# Patient Record
Sex: Male | Born: 1935 | Race: White | Hispanic: No | Marital: Married
Health system: Southern US, Community
[De-identification: ages and names within clinical notes are randomized; demographics above are authoritative.]

---

## 2005-07-27 ENCOUNTER — Ambulatory Visit: Payer: Self-pay | Admitting: Family Medicine

## 2006-01-18 ENCOUNTER — Ambulatory Visit: Payer: Self-pay | Admitting: Gastroenterology

## 2006-01-26 ENCOUNTER — Ambulatory Visit: Payer: Self-pay | Admitting: Gastroenterology

## 2006-08-30 ENCOUNTER — Ambulatory Visit: Payer: Self-pay | Admitting: Family Medicine

## 2010-03-23 ENCOUNTER — Ambulatory Visit: Payer: Self-pay | Admitting: Family Medicine

## 2010-05-04 ENCOUNTER — Ambulatory Visit: Payer: Self-pay | Admitting: Gastroenterology

## 2010-05-05 LAB — PATHOLOGY REPORT

## 2012-08-02 IMAGING — US US CAROTID DUPLEX BILAT
1 series · 17 of 24 positions shown · non-contrast
Comparison: none

REASON FOR EXAM: bruit
COMMENTS:

[Series 1: us carotid duplex bilat · 17 of 78 slices shown]
[im 1/78]
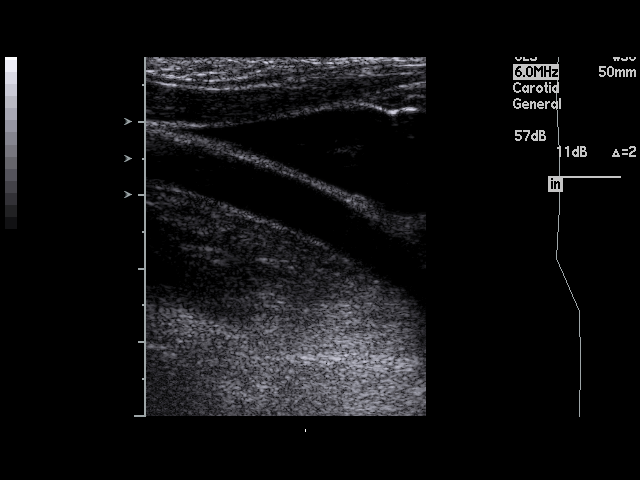
[im 7/78]
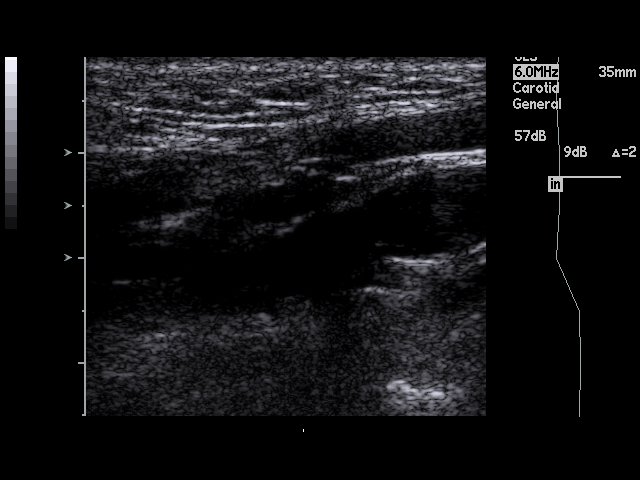
[im 11/78]
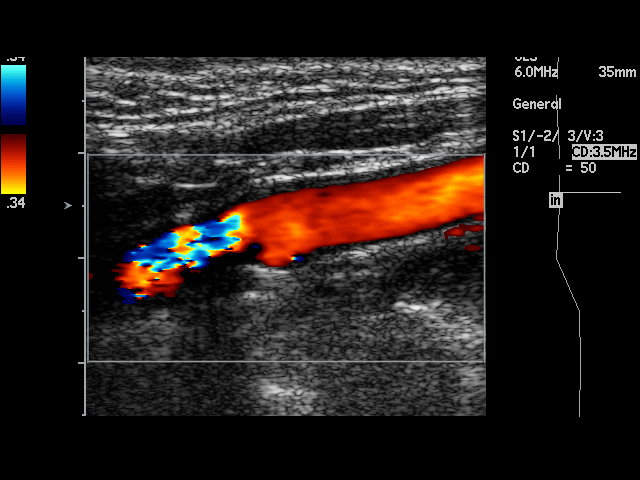
[im 14/78]
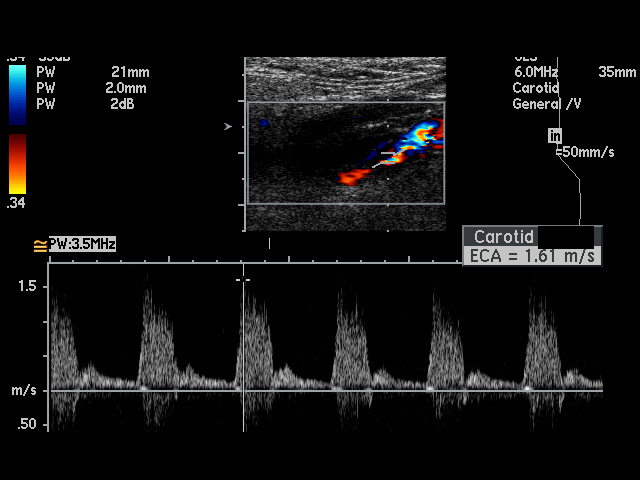
[im 21/78]
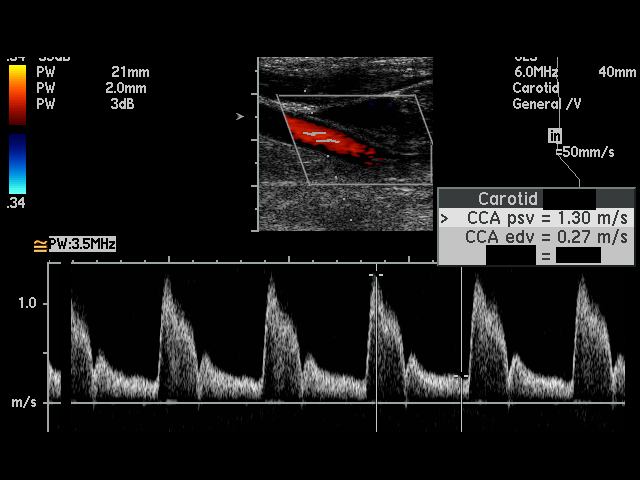
[im 24/78]
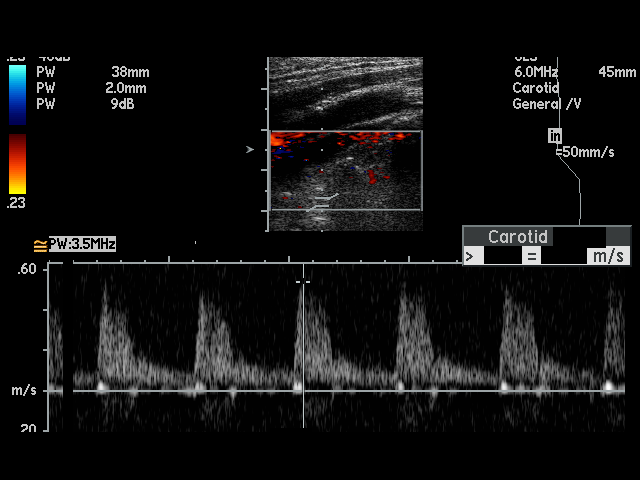
[im 31/78]
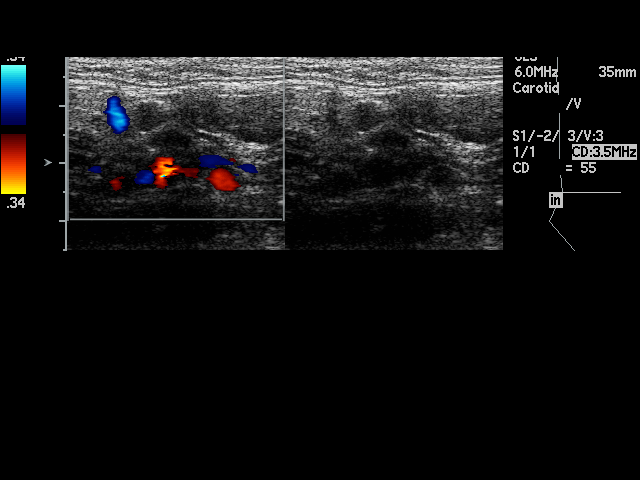
[im 34/78]
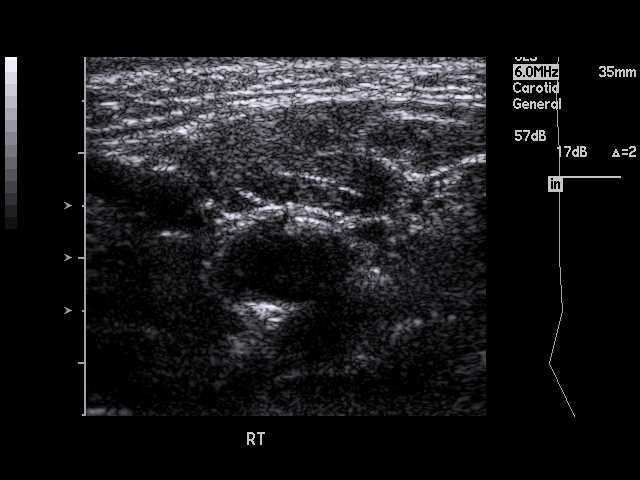
[im 41/78]
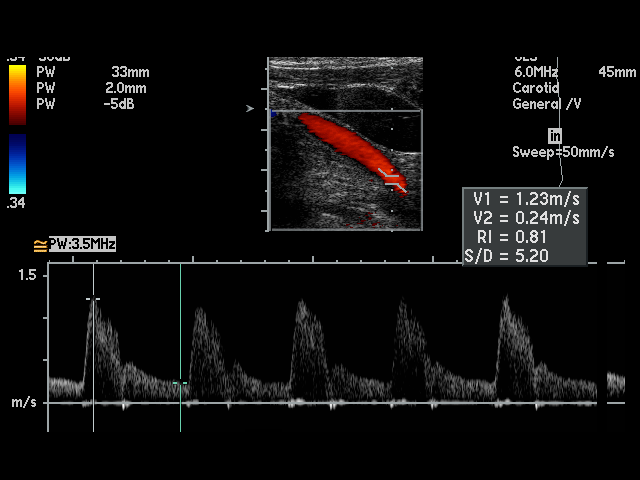
[im 44/78]
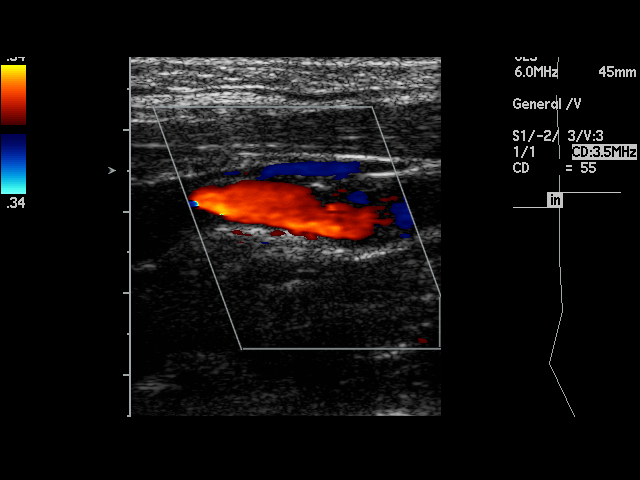
[im 47/78]
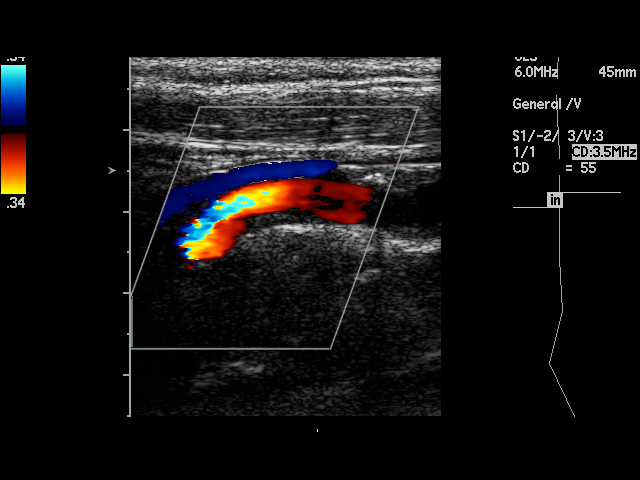
[im 54/78]
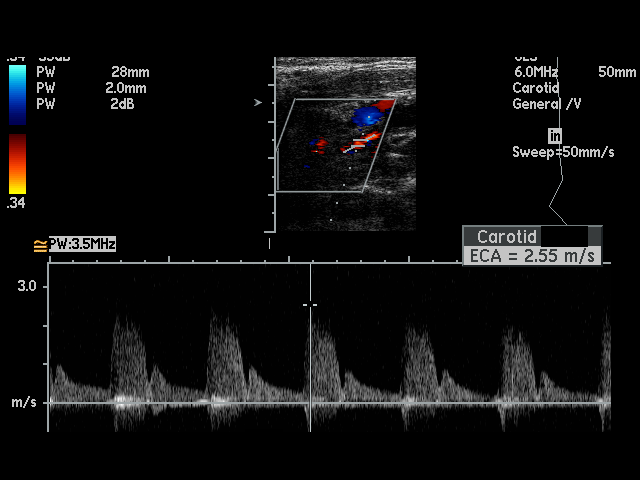
[im 57/78]
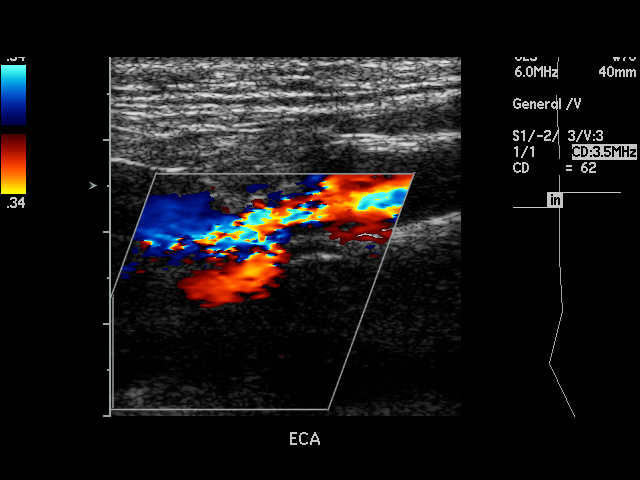
[im 64/78]
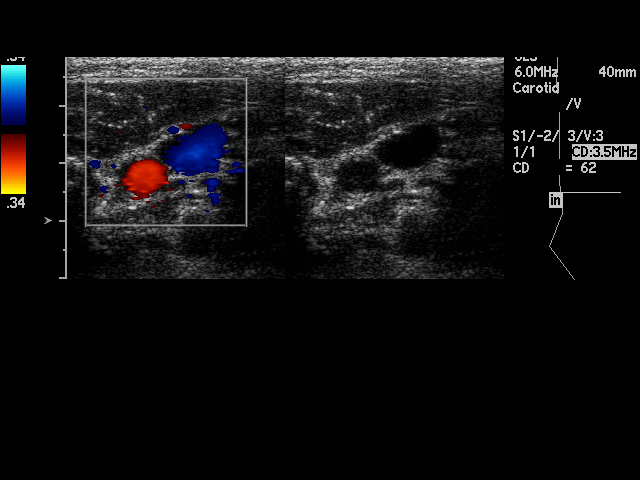
[im 67/78]
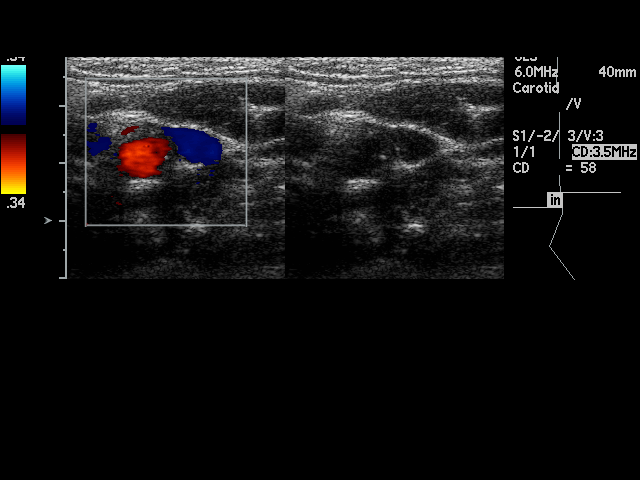
[im 71/78]
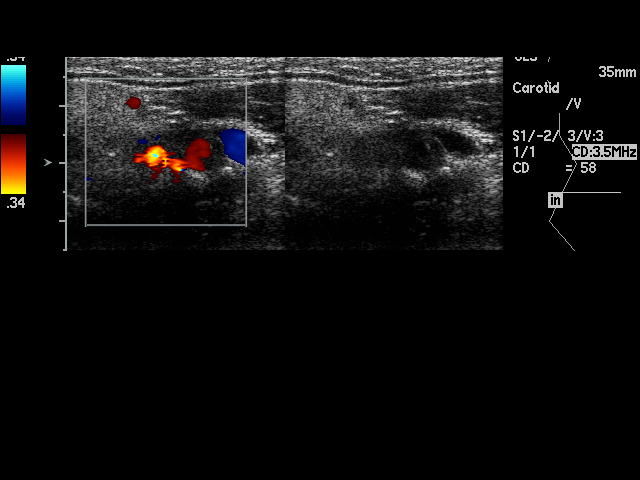
[im 78/78]
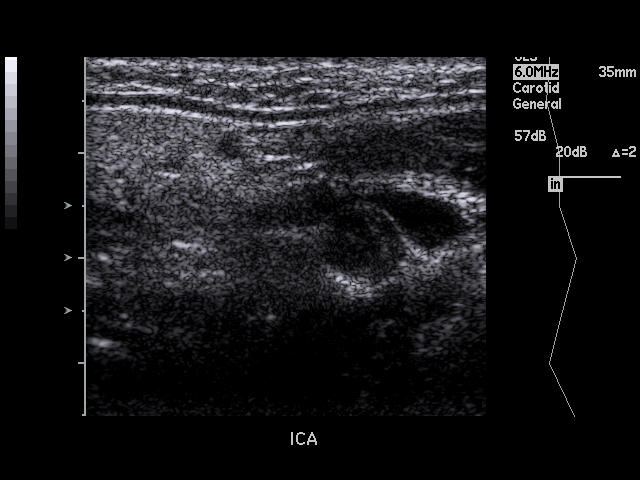

[17 of 24 positions shown; findings below may reference images not displayed]

PROCEDURE:     US  - US CAROTID DOPPLER BILATERAL  - March 23, 2010 [DATE]

RESULT:     There is a small amount of soft and calcific plaque formation
about the carotid bifurcations bilaterally.

On the right, the peak right common carotid artery flow velocity measures
1.3 meters per second and the peak right internal carotid artery flow
velocity measures 0.8 meters per second. The ICA/CCA ratio is 0.615.

On the left, the peak left common carotid artery flow velocity measures
meters per second and the peak left internal carotid artery flow velocity
measures 1.1 meters per second. The ICA/CCA ratio is 1.

These values bilaterally are in the normal range and are consistent with the
absence of hemodynamically significant stenosis.

There is observed antegrade flow in both vertebrals.
IMPRESSION: 1.  There is a small amount of plaque formation noted bilaterally.
2.  No hemodynamically significant stenosis is seen on either side.
3.  There is antegrade flow in both vertebrals.

## 2020-03-27 ENCOUNTER — Emergency Department: Payer: Medicare Other

## 2020-03-27 ENCOUNTER — Emergency Department
Admission: EM | Admit: 2020-03-27 | Discharge: 2020-03-28 | Disposition: A | Discharge status: Home / Self Care | Payer: Medicare Other | Attending: Emergency Medicine | Admitting: Emergency Medicine

## 2020-03-27 DIAGNOSIS — R531 Weakness: Secondary | ICD-10-CM | POA: Diagnosis present

## 2020-03-27 DIAGNOSIS — U071 COVID-19: Secondary | ICD-10-CM | POA: Diagnosis not present

## 2020-03-27 DIAGNOSIS — Z85828 Personal history of other malignant neoplasm of skin: Secondary | ICD-10-CM | POA: Diagnosis not present

## 2020-03-27 LAB — COMPREHENSIVE METABOLIC PANEL
ALT: 23 U/L (ref 0–44)
AST: 36 U/L (ref 15–41)
Albumin: 4.1 g/dL (ref 3.5–5.0)
Alkaline Phosphatase: 55 U/L (ref 38–126)
Anion gap: 12 (ref 5–15)
BUN: 20 mg/dL (ref 8–23)
CO2: 24 mmol/L (ref 22–32)
Calcium: 9 mg/dL (ref 8.9–10.3)
Chloride: 98 mmol/L (ref 98–111)
Creatinine, Ser: 1.14 mg/dL (ref 0.61–1.24)
GFR, Estimated: >60 mL/min (ref >60)
Glucose, Bld: 136 mg/dL — ABNORMAL HIGH (ref 70–99)
Potassium: 4.2 mmol/L (ref 3.5–5.1)
Sodium: 134 mmol/L — ABNORMAL LOW (ref 135–145)
Total Bilirubin: 1.1 mg/dL (ref 0.3–1.2)
Total Protein: 7.2 g/dL (ref 6.5–8.1)

## 2020-03-27 LAB — CBC WITH DIFFERENTIAL/PLATELET
Abs Immature Granulocytes: 0.03 10*3/uL (ref 0.00–0.07)
Basophils Absolute: 0 10*3/uL (ref 0.0–0.1)
Basophils Relative: 0 %
Eosinophils Absolute: 0 10*3/uL (ref 0.0–0.5)
Eosinophils Relative: 0 %
HCT: 40.7 % (ref 39.0–52.0)
Hemoglobin: 13.8 g/dL (ref 13.0–17.0)
Immature Granulocytes: 0 %
Lymphocytes Relative: 9 %
Lymphs Abs: 0.7 10*3/uL (ref 0.7–4.0)
MCH: 31.9 pg (ref 26.0–34.0)
MCHC: 33.9 g/dL (ref 30.0–36.0)
MCV: 94.2 fL (ref 80.0–100.0)
Monocytes Absolute: 0.6 10*3/uL (ref 0.1–1.0)
Monocytes Relative: 8 %
Neutro Abs: 6.3 10*3/uL (ref 1.7–7.7)
Neutrophils Relative %: 83 %
Platelets: 141 10*3/uL — ABNORMAL LOW (ref 150–400)
RBC: 4.32 MIL/uL (ref 4.22–5.81)
RDW: 12.4 % (ref 11.5–15.5)
WBC: 7.6 10*3/uL (ref 4.0–10.5)
nRBC: 0 % (ref 0.0–0.2)

## 2020-03-27 LAB — PROTIME-INR
INR: 1.1 (ref 0.8–1.2)
Prothrombin Time: 14.1 seconds (ref 11.4–15.2)

## 2020-03-27 LAB — LACTIC ACID, PLASMA: Lactic Acid, Venous: 1.2 mmol/L (ref 0.5–1.9)

## 2020-03-27 LAB — TROPONIN I (HIGH SENSITIVITY): Troponin I (High Sensitivity): 22 ng/L — ABNORMAL HIGH (ref <18)

## 2020-03-27 MED ORDER — ACETAMINOPHEN 500 MG PO TABS
1000.0000 mg | ORAL_TABLET | Freq: Once | ORAL | Status: AC
  Administered 2020-03-27: 1000 mg via ORAL

## 2020-03-27 MED ORDER — LACTATED RINGERS IV BOLUS
1000.0000 mL | Freq: Once | INTRAVENOUS | Status: AC
  Administered 2020-03-28: 1000 mL via INTRAVENOUS

## 2020-03-27 NOTE — ED Triage Notes (Signed)
Pt in triage with reports of weakness x3 days. Pt with fever in triage. Pt poor historian. Pt appears confused/altered. Unknown baseline due to no previous hx in chart. Productive cough heard as well as pt nose dripping in triage.

## 2020-03-27 NOTE — ED Provider Notes (Signed)
[facility] Emergency Department Provider Note  ____________________________________________  Time seen: Approximately 11:32 PM  I have reviewed the triage vital signs and the nursing notes.   HISTORY  Chief Complaint No chief complaint on file.   HPI Kenneth Campbell is a 87 y.o. male with a history of hyperlipidemia, GERD, borderline hyperglycemia who presents from home for generalized weakness.  Patient has been feeling ill for 3 days.  Has had a fever, mild productive cough, congestion, generalized weakness, and a sore throat.  Patient is vaccinated against Covid.  No known sick contact exposures.  He denies chest pain or shortness of breath, abdominal pain, vomiting or diarrhea, dysuria or hematuria.  His symptoms have been getting progressively worse.   PMH GERD (gastroesophageal reflux disease)    Hyperlipidemia    Hearing loss in right ear    Left carotid bruit  Last carotid ultrasound, 03/23/10, mild plaque bilaterally, no stenosis.  Gynecomastia, male  with normal mammogram, May 2007  Hyperglycemia    Skin cancer  squamous and basal cell     Allergies Patient has no known allergies.  No family history on file.  Social History Smoking - never Alcohol - no Drugs - no    Review of Systems  Constitutional: + fever, generalized weakness Eyes: Negative for visual changes. ENT: + sore throat and congestion Neck: No neck pain  Cardiovascular: Negative for chest pain. Respiratory: Negative for shortness of breath. + Cough Gastrointestinal: Negative for abdominal pain, vomiting or diarrhea. Genitourinary: Negative for dysuria. Musculoskeletal: Negative for back pain. Skin: Negative for rash. Neurological: Negative for headaches, weakness or numbness. Psych: No SI or HI  ____________________________________________   PHYSICAL EXAM:  VITAL SIGNS: ED Triage Vitals  Enc Vitals Group     BP 03/27/20 2201 (!) 135/59      Pulse Rate 03/27/20 2201 92     Resp 03/27/20 2201 18     Temp 03/27/20 2201 (!) 101.2 F (38.4 C)     Temp Source 03/27/20 2201 Oral     SpO2 03/27/20 2201 96 %     Weight 03/27/20 2202 150 lb (68 kg)     Height 03/27/20 2202 5\' 11"  (1.803 m)     Head Circumference --      Peak Flow --      Pain Score --      Pain Loc --      Pain Edu? --      Excl. in GC? --     Constitutional: Alert and oriented. Well appearing and in no apparent distress. HEENT:      Head: Normocephalic and atraumatic.         Eyes: Conjunctivae are normal. Sclera is non-icteric.       Mouth/Throat: Mucous membranes are moist.       Neck: Supple with no signs of meningismus. Cardiovascular: Regular rate and rhythm. No murmurs, gallops, or rubs. 2+ symmetrical distal pulses are present in all extremities. No JVD. Respiratory: Normal respiratory effort. Lungs are clear to auscultation bilaterally. No wheezes, crackles, or rhonchi.  Gastrointestinal: Soft, non tender, and non distended. Musculoskeletal: No edema, cyanosis, or erythema of extremities. Neurologic: Normal speech and language. Face is symmetric. Moving all extremities. No gross focal neurologic deficits are appreciated. Skin: Skin is warm, dry and intact. No rash noted. Psychiatric: Mood and affect are normal. Speech and behavior are normal.  ____________________________________________   LABS (all labs ordered are listed, but only abnormal results are displayed)  Labs Reviewed  COMPREHENSIVE METABOLIC PANEL - Abnormal; Notable for the following components:      Result Value   Sodium 134 (*)    Glucose, Bld 136 (*)    All other components within normal limits  CBC WITH DIFFERENTIAL/PLATELET - Abnormal; Notable for the following components:   Platelets 141 (*)    All other components within normal limits  URINALYSIS, COMPLETE (UACMP) WITH MICROSCOPIC - Abnormal; Notable for the following components:   Color, Urine YELLOW (*)    APPearance  HAZY (*)    Specific Gravity, Urine 1.031 (*)    Hgb urine dipstick SMALL (*)    Ketones, ur 20 (*)    Protein, ur 100 (*)    All other components within normal limits  POC SARS CORONAVIRUS 2 AG -  ED - Abnormal; Notable for the following components:   SARS Coronavirus 2 Ag POSITIVE (*)    All other components within normal limits  TROPONIN I (HIGH SENSITIVITY) - Abnormal; Notable for the following components:   Troponin I (High Sensitivity) 22 (*)    All other components within normal limits  TROPONIN I (HIGH SENSITIVITY) - Abnormal; Notable for the following components:   Troponin I (High Sensitivity) 22 (*)    All other components within normal limits  CULTURE, BLOOD (ROUTINE X 2)  CULTURE, BLOOD (ROUTINE X 2)  LACTIC ACID, PLASMA  LACTIC ACID, PLASMA  PROTIME-INR  PROCALCITONIN   ____________________________________________  EKG  ED ECG REPORT I, Rudene Re, the attending physician, personally viewed and interpreted this ECG.  Sinus rhythm, rate of 76, normal intervals, T wave inversions in inferior lateral leads with no ST elevations or depressions.  No prior for comparison. ____________________________________________  RADIOLOGY  I have personally reviewed the images performed during this visit and I agree with the Radiologist's read.   Interpretation by Radiologist:  DG Chest 2 View  Result Date: 03/27/2020 CLINICAL DATA:  Weakness for 3 days.  Fever.  Confusion. EXAM: CHEST - 2 VIEW COMPARISON:  CT 01/26/2006 FINDINGS: Emphysematous changes in the lungs. Linear fibrosis or atelectasis in the lung bases. No focal consolidation or edema. No pleural effusions. No pneumothorax. Mediastinal contours appear intact. Normal heart size and pulmonary vascularity. Degenerative changes in the spine and shoulders. IMPRESSION: Emphysematous changes in the lungs with linear fibrosis or atelectasis in the lung bases. Electronically Signed   By: Lucienne Capers M.D.   On:  03/27/2020 23:00     ____________________________________________   PROCEDURES  Procedure(s) performed:yes .1-3 Lead EKG Interpretation Performed by: Rudene Re, MD Authorized by: Rudene Re, MD     Interpretation: non-specific     ECG rate assessment: normal     Rhythm: sinus rhythm     Ectopy: none     Critical Care performed:  None ____________________________________________   INITIAL IMPRESSION / ASSESSMENT AND PLAN / ED COURSE  87 y.o. male with a history of hyperlipidemia, GERD, borderline hyperglycemia who presents from home for generalized weakness, cough, malaise, sore throat, congestion x3 days.  Patient vaccinated against Covid.  Normal work of breathing and normal sats, febrile with a temp of 103.39F but not tachycardic.  Normotensive.  Lungs are clear to auscultation bilaterally, abdomen is soft and nontender.  No meningeal signs.  Chest x-ray visualized by me with no acute findings when compared to prior, confirmed by radiology.  Initial troponin of 22-second is pending.  Patient does have T wave inversions inferior laterally but no prior for comparison.  Normal white count,  no anemia, normal lactic acidosis, normal coags, no significant electrolyte derangements.  Mildly elevated blood glucose of 136 with a history of such.  Covid and flu pending.  Will give Tylenol for fever, IV fluids for malaise.  Will check urine.  Ddx sepsis, covid, flu, pna, uti  Old medical records reviewed including most recent PCP visit in 04/2019  _________________________ 2:23 AM on 03/28/2020 -----------------------------------------  Patient is Covid positive.  Normal work of breathing and normal sats both at rest and with ambulation.  Patient able to ambulate without assistance.  Lives at home with his wife.  Discussed immune system boosting, Mab referral, symptom management at home, oxygen monitoring with pulse oximeter, and follow-up at post Covid clinic with patient.   Discussed quarantine of him and his wife.  Patient is stable for discharge home.    _____________________________________________ Please note:  Patient was evaluated in Emergency Department today for the symptoms described in the history of present illness. Patient was evaluated in the context of the global COVID-19 pandemic, which necessitated consideration that the patient might be at risk for infection with the SARS-CoV-2 virus that causes COVID-19. Institutional protocols and algorithms that pertain to the evaluation of patients at risk for COVID-19 are in a state of rapid change based on information released by regulatory bodies including the CDC and federal and state organizations. These policies and algorithms were followed during the patient's care in the ED.  Some ED evaluations and interventions may be delayed as a result of limited staffing during the pandemic.   [place] Controlled Substance Database was reviewed by me. ____________________________________________   FINAL CLINICAL IMPRESSION(S) / ED DIAGNOSES   Final diagnoses:  COVID-19      NEW MEDICATIONS STARTED DURING THIS VISIT:  ED Discharge Orders    None       Note:  This document was prepared using Dragon voice recognition software and may include unintentional dictation errors.    Nita Sickle, MD 03/28/20 [phone]

## 2020-03-28 LAB — URINALYSIS, COMPLETE (UACMP) WITH MICROSCOPIC
Bacteria, UA: NONE SEEN
Bilirubin Urine: NEGATIVE
Glucose, UA: NEGATIVE mg/dL
Ketones, ur: 20 mg/dL — AB
Leukocytes,Ua: NEGATIVE
Nitrite: NEGATIVE
Protein, ur: 100 mg/dL — AB
Specific Gravity, Urine: 1.031 — ABNORMAL HIGH (ref 1.005–1.030)
Squamous Epithelial / HPF: NONE SEEN (ref 0–5)
pH: 5 (ref 5.0–8.0)

## 2020-03-28 LAB — POC SARS CORONAVIRUS 2 AG -  ED: SARS Coronavirus 2 Ag: POSITIVE — AB

## 2020-03-28 LAB — LACTIC ACID, PLASMA: Lactic Acid, Venous: 0.8 mmol/L (ref 0.5–1.9)

## 2020-03-28 LAB — PROCALCITONIN: Procalcitonin: 0.15 ng/mL

## 2020-03-28 LAB — TROPONIN I (HIGH SENSITIVITY): Troponin I (High Sensitivity): 22 ng/L — ABNORMAL HIGH (ref <18)

## 2020-03-28 MED ORDER — ALUM & MAG HYDROXIDE-SIMETH 200-200-20 MG/5ML PO SUSP
15.0000 mL | Freq: Once | ORAL | Status: DC
  Filled 2020-03-28: qty 30

## 2020-03-28 MED ORDER — ALUM & MAG HYDROXIDE-SIMETH 200-200-20 MG/5ML PO SUSP
30.0000 mL | Freq: Once | ORAL | Status: AC
  Administered 2020-03-28: 30 mL via ORAL

## 2020-03-28 NOTE — ED Notes (Signed)
E signature pad not working. Pt educated on discharge instructions and verbalized understanding.  

## 2020-03-28 NOTE — ED Notes (Signed)
PT ambulated with SPO2 cable. Pt denies any Shob. Pt maintained o2 sat of 96% or greater.

## 2020-03-28 NOTE — Discharge Instructions (Signed)
Post- COVID Clinic  [phone]  To help boost your immune system against COVID-19, please take:  - Vitamin D3 4,000 IU/day - Vitamin C 500-1,000?mg twice a day - Quercetin 250?mg twice a day - Zinc 100?mg/day - Melatonin 10?mg before bedtime (causes drowsiness) - Aspirin 325?mg/day (unless contraindicated) - Pulse Oximeter Monitoring of oxygen saturation is recommended - check your oxygen 3 times a day if less than 90% return to the ER  These medications are all over-the-counter and do not need a prescription.   QUARANTINE INSTRUCTION  Follow these instructions at home:  Protecting others To avoid spreading the illness to other people: Quarantine in your home until you have had no cough and fever for 7 days. Household members should also be quarantine for at least 14 days after being exposed to you. Wash your hands often with soap and water. If soap and water are not available, use an alcohol-based hand sanitizer. If you have not cleaned your hands, do not touch your face. Make sure that all people in your household wash their hands well and often. Cover your nose and mouth when you cough or sneeze. Throw away used tissues. Stay home if you have any cold-like or flu-like symptoms. General instructions Go to your local pharmacy and buy a pulse oximeter (this is a machine that measures your oxygen). Check your oxygen levels at least 3 times a day. If your oxygen level is 90% or less return to the emergency room immediately Take over-the-counter and prescription medicines only as told by your health care provider. If you need medication for fever take tylenol or ibuprofen Drink enough fluid to keep your urine pale yellow. Rest at home as directed by your health care provider. Do not give aspirin to a child with the flu, because of the association with Reye's syndrome. Do not use tobacco products, including cigarettes, chewing tobacco, and e-cigarettes. If you need help quitting,  ask your health care provider. Keep all follow-up visits as told by your health care provider. This is important. How is this prevented? Avoid areas where an outbreak has been reported. Avoid large groups of people. Keep a safe distance from people who are coughing and sneezing. Do not touch your face if you have not cleaned your hands. When you are around people who are sick or might be sick, wear a mask to protect yourself. Contact a health care provider if: You have symptoms of SARS (cough, fever, chest pain, shortness of breath) that are not getting better at home. You have a fever. If you have difficulty breathing go to your local ER or call 911   

## 2020-04-01 LAB — CULTURE, BLOOD (ROUTINE X 2)
Culture: NO GROWTH
Culture: NO GROWTH
Special Requests: ADEQUATE
Special Requests: ADEQUATE

## 2022-08-07 IMAGING — CR DG CHEST 2V
2 series · 2 of 2 positions shown · non-contrast
Comparison: CT 01/26/2006

CLINICAL DATA: Weakness for 3 days.  Fever.  Confusion.

EXAM:
CHEST - 2 VIEW

[chest lat]
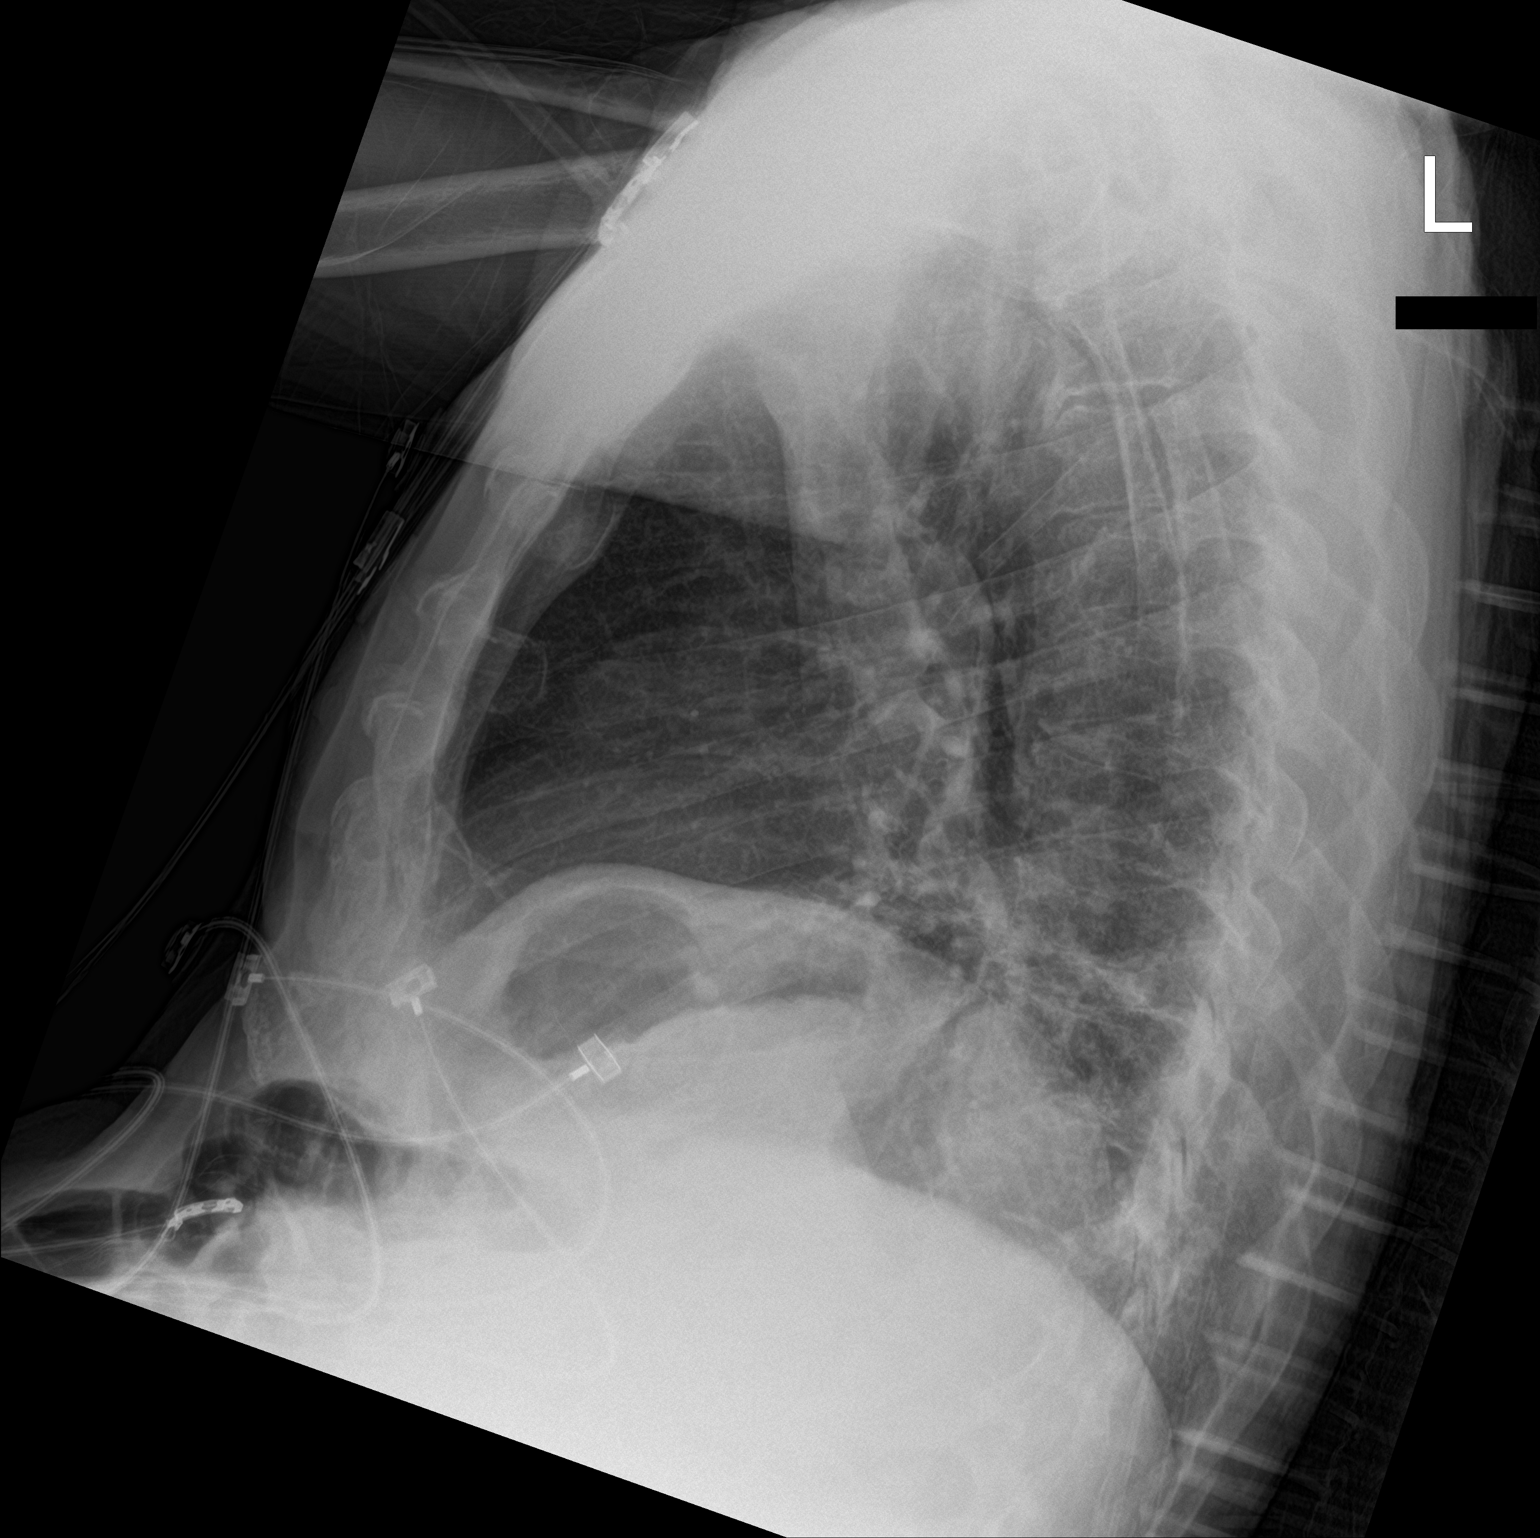

[chest ap]
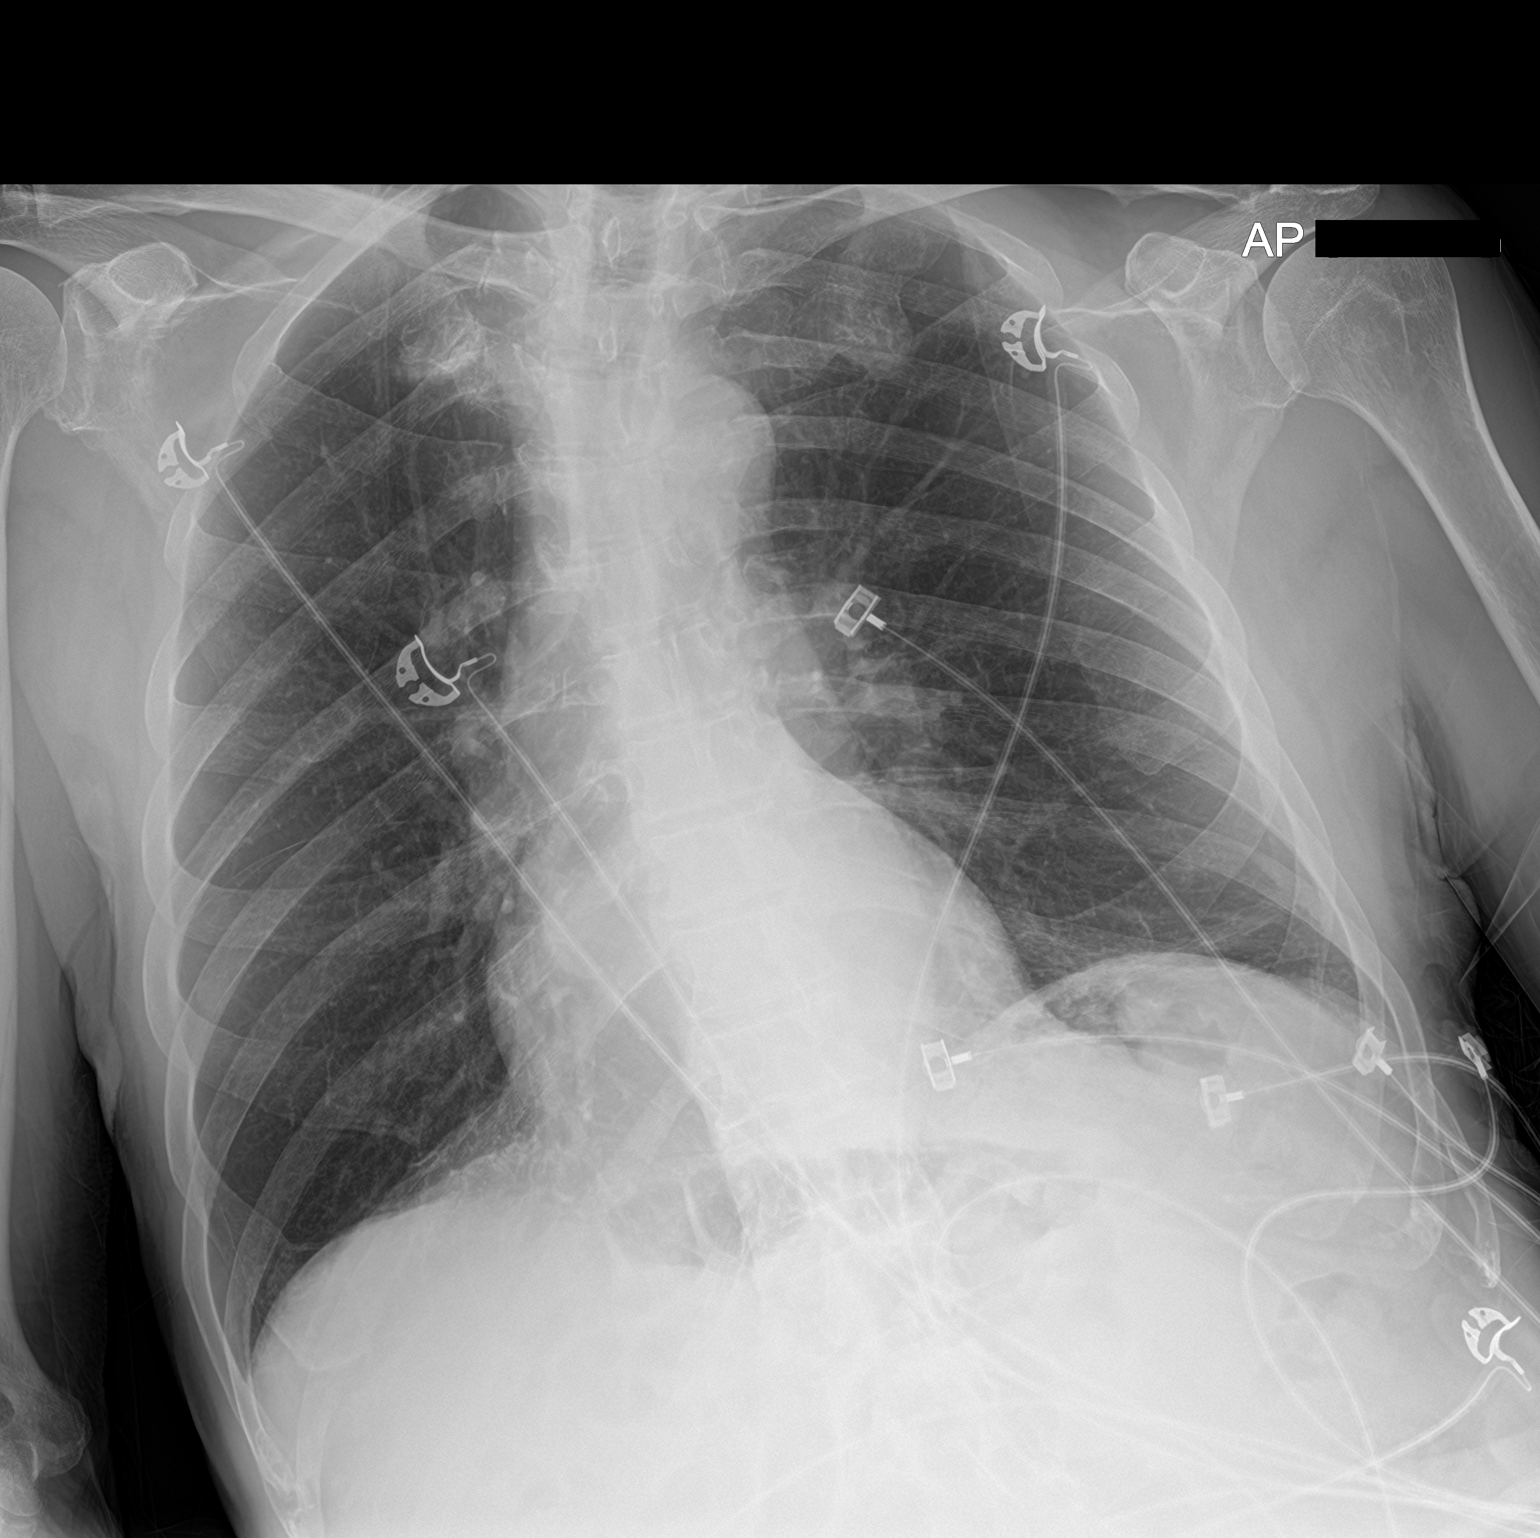

[2 of 2 positions shown; findings below may reference images not displayed]

FINDINGS: Emphysematous changes in the lungs. Linear fibrosis or atelectasis
in the lung bases. No focal consolidation or edema. No pleural
effusions. No pneumothorax. Mediastinal contours appear intact.
Normal heart size and pulmonary vascularity. Degenerative changes in
the spine and shoulders.
IMPRESSION: Emphysematous changes in the lungs with linear fibrosis or
atelectasis in the lung bases.
